# Patient Record
Sex: Female | Born: 2000 | Race: White | Hispanic: No | Marital: Single | Smoking: Never smoker
Health system: Southern US, Community
[De-identification: ages and names within clinical notes are randomized; demographics above are authoritative.]

## PROBLEM LIST (undated history)

## (undated) DIAGNOSIS — K219 Gastro-esophageal reflux disease without esophagitis: Secondary | ICD-10-CM

## (undated) DIAGNOSIS — Z22322 Carrier or suspected carrier of Methicillin resistant Staphylococcus aureus: Secondary | ICD-10-CM

## (undated) HISTORY — PX: TONSILLECTOMY: SUR1361

---
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
## 2020-06-03 ENCOUNTER — Emergency Department (HOSPITAL_COMMUNITY): Payer: Medicaid Other

## 2020-06-03 ENCOUNTER — Other Ambulatory Visit: Payer: Self-pay

## 2020-06-03 ENCOUNTER — Encounter (HOSPITAL_COMMUNITY): Payer: Self-pay | Admitting: Emergency Medicine

## 2020-06-03 ENCOUNTER — Emergency Department (HOSPITAL_COMMUNITY)
Admission: EM | Admit: 2020-06-03 | Discharge: 2020-06-03 | Disposition: A | Discharge status: Home / Self Care | Payer: Medicaid Other | Attending: Emergency Medicine | Admitting: Emergency Medicine

## 2020-06-03 DIAGNOSIS — R112 Nausea with vomiting, unspecified: Secondary | ICD-10-CM | POA: Insufficient documentation

## 2020-06-03 DIAGNOSIS — R1013 Epigastric pain: Secondary | ICD-10-CM | POA: Diagnosis not present

## 2020-06-03 DIAGNOSIS — N644 Mastodynia: Secondary | ICD-10-CM | POA: Insufficient documentation

## 2020-06-03 DIAGNOSIS — Z7722 Contact with and (suspected) exposure to environmental tobacco smoke (acute) (chronic): Secondary | ICD-10-CM | POA: Diagnosis not present

## 2020-06-03 DIAGNOSIS — R1010 Upper abdominal pain, unspecified: Secondary | ICD-10-CM | POA: Diagnosis present

## 2020-06-03 LAB — COMPREHENSIVE METABOLIC PANEL
ALT: 35 U/L (ref 0–44)
AST: 24 U/L (ref 15–41)
Albumin: 3.7 g/dL (ref 3.5–5.0)
Alkaline Phosphatase: 74 U/L (ref 38–126)
Anion gap: 9 (ref 5–15)
BUN: 8 mg/dL (ref 6–20)
CO2: 25 mmol/L (ref 22–32)
Calcium: 9 mg/dL (ref 8.9–10.3)
Chloride: 103 mmol/L (ref 98–111)
Creatinine, Ser: 0.61 mg/dL (ref 0.44–1.00)
GFR, Estimated: >60 mL/min (ref >60)
Glucose, Bld: 82 mg/dL (ref 70–99)
Potassium: 3.8 mmol/L (ref 3.5–5.1)
Sodium: 137 mmol/L (ref 135–145)
Total Bilirubin: 0.5 mg/dL (ref 0.3–1.2)
Total Protein: 7.4 g/dL (ref 6.5–8.1)

## 2020-06-03 LAB — CBC WITH DIFFERENTIAL/PLATELET
Abs Immature Granulocytes: 0.04 10*3/uL (ref 0.00–0.07)
Basophils Absolute: 0.1 10*3/uL (ref 0.0–0.1)
Basophils Relative: 0 %
Eosinophils Absolute: 0.1 10*3/uL (ref 0.0–0.5)
Eosinophils Relative: 1 %
HCT: 35.2 % — ABNORMAL LOW (ref 36.0–46.0)
Hemoglobin: 11.3 g/dL — ABNORMAL LOW (ref 12.0–15.0)
Immature Granulocytes: 0 %
Lymphocytes Relative: 30 %
Lymphs Abs: 3.5 10*3/uL (ref 0.7–4.0)
MCH: 28.7 pg (ref 26.0–34.0)
MCHC: 32.1 g/dL (ref 30.0–36.0)
MCV: 89.3 fL (ref 80.0–100.0)
Monocytes Absolute: 1 10*3/uL (ref 0.1–1.0)
Monocytes Relative: 9 %
Neutro Abs: 7.1 10*3/uL (ref 1.7–7.7)
Neutrophils Relative %: 60 %
Platelets: 409 10*3/uL — ABNORMAL HIGH (ref 150–400)
RBC: 3.94 MIL/uL (ref 3.87–5.11)
RDW: 12.3 % (ref 11.5–15.5)
WBC: 11.8 10*3/uL — ABNORMAL HIGH (ref 4.0–10.5)
nRBC: 0 % (ref 0.0–0.2)

## 2020-06-03 LAB — URINALYSIS, ROUTINE W REFLEX MICROSCOPIC
Bilirubin Urine: NEGATIVE
Glucose, UA: NEGATIVE mg/dL
Hgb urine dipstick: NEGATIVE
Ketones, ur: NEGATIVE mg/dL
Leukocytes,Ua: NEGATIVE
Nitrite: NEGATIVE
Protein, ur: NEGATIVE mg/dL
Specific Gravity, Urine: 1.02 (ref 1.005–1.030)
pH: 7 (ref 5.0–8.0)

## 2020-06-03 LAB — LIPASE, BLOOD: Lipase: 35 U/L (ref 11–51)

## 2020-06-03 LAB — HCG, QUANTITATIVE, PREGNANCY: hCG, Beta Chain, Quant, S: <1 m[IU]/mL (ref <5)

## 2020-06-03 MED ORDER — ONDANSETRON HCL 4 MG PO TABS: 4.0000 mg | ORAL_TABLET | Freq: Three times a day (TID) | ORAL | 0 refills | Status: AC | PRN

## 2020-06-03 MED ORDER — PANTOPRAZOLE SODIUM 40 MG PO TBEC: 40.0000 mg | DELAYED_RELEASE_TABLET | Freq: Every day | ORAL | 0 refills | Status: AC

## 2020-06-03 NOTE — Discharge Instructions (Signed)
As discussed, take the pantoprazole daily as directed for at least 2 weeks.  Take the ondansetron if needed for nausea or vomiting.  Follow-up with your primary care provider for recheck.  Return to the emergency department if you develop any new or worsening symptoms such as increasing abdominal pain, fever or persistent vomiting.

## 2020-06-03 NOTE — ED Triage Notes (Signed)
Vomits every morning x 2 weeks, only when she wakes up each day.  Had neg preg test on Saturday and neg covid today.

## 2020-06-05 NOTE — ED Provider Notes (Signed)
[facility] EMERGENCY DEPARTMENT Provider Note   CSN: [id number] Arrival date & time: 06/03/20  1120     History Chief Complaint  Patient presents with  . Emesis    Tanya Berger is a 20 y.o. female.  HPI      Tanya Berger is a 20 y.o. female who presents to the Emergency Department complaining of morning nausea and vomiting, breast tenderness, and feels as though her abdomen is distended.  Symptoms have been present for 2 weeks.  She is concerned that she may be pregnant.  LMP 04/22/2020.  She took a home pregnancy test 3 days ago that was negative.  Also had a negative COVID test.  Endorses some upper abdominal pain.  She denies diarrhea, back or flank pain, dysuria, vaginal discharge.  No fever or chills.    Past Medical History:  Diagnosis Date  . Acid reflux   . MRSA (methicillin resistant staph aureus) culture positive     Patient Active Problem List   Diagnosis Date Noted  . Distal radius fracture, right 10/06/17 11/22/2017    Past Surgical History:  Procedure Laterality Date  . TONSILLECTOMY       OB History   No obstetric history on file.     Family History  Problem Relation Age of Onset  . Hyperlipidemia Mother     Social History   Tobacco Use  . Smoking status: Passive Smoke Exposure - Never Smoker  . Smokeless tobacco: Never Used  Substance Use Topics  . Alcohol use: Never  . Drug use: Never    Home Medications Prior to Admission medications   Medication Sig Start Date End Date Taking? Authorizing Provider  ondansetron (ZOFRAN) 4 MG tablet Take 1 tablet (4 mg total) by mouth every 8 (eight) hours as needed for nausea or vomiting. 06/03/20  Yes Joni Colegrove, PA-C  pantoprazole (PROTONIX) 40 MG tablet Take 1 tablet (40 mg total) by mouth daily. In the morning 06/03/20  Yes Jaylianna Tatlock, PA-C  Amphet-Dextroamphet 3-Bead ER 37.5 MG CP24 Take 1 capsule by mouth daily.    [provider]  busPIRone (BUSPAR) 15 MG tablet Take 15  mg by mouth 2 (two) times daily.    [provider]  ibuprofen (ADVIL,MOTRIN) 400 MG tablet Take 1 tablet (400 mg total) by mouth every 6 (six) hours as needed. 10/06/17   Burgess Amor, PA-C  loratadine (CLARITIN) 10 MG tablet Take 10 mg by mouth daily.    [provider]  Norgestimate-Ethinyl Estradiol Triphasic (TRI-LO-MARZIA) 0.18/0.215/0.25 MG-25 MCG tab Take 1 tablet by mouth daily.    [provider]  ondansetron (ZOFRAN ODT) 4 MG disintegrating tablet Take 1 tablet (4 mg total) by mouth every 8 (eight) hours as needed for nausea or vomiting. 05/25/17   Long, Arlyss Repress, MD  ranitidine (ZANTAC) 75 MG tablet Take 75 mg by mouth 2 (two) times daily as needed for heartburn.    [provider]  traZODone (DESYREL) 50 MG tablet Take 50 mg by mouth at bedtime.    [provider]    Allergies    Penicillins  Review of Systems   Review of Systems  Constitutional: Negative for chills, fatigue and fever.  HENT: Negative for sore throat.   Respiratory: Negative for shortness of breath and wheezing.   Cardiovascular: Negative for chest pain and palpitations.  Gastrointestinal: Positive for abdominal pain, nausea and vomiting. Negative for blood in stool and constipation.  Genitourinary: Positive for menstrual problem. Negative for  dysuria, flank pain, frequency, vaginal bleeding and vaginal discharge.  Musculoskeletal: Negative for arthralgias, back pain, myalgias, neck pain and neck stiffness.  Skin: Negative for rash.  Neurological: Negative for dizziness, weakness and numbness.  Hematological: Does not bruise/bleed easily.    Physical Exam Updated Vital Signs BP 110/66   Pulse 73   Temp 98.4 F (36.9 C) (Oral)   Resp 18   Ht 5\' 2"  (1.575 m)   Wt 87.1 kg   LMP 04/22/2020   SpO2 99%   BMI 35.12 kg/m   Physical Exam Vitals and nursing note reviewed.  Constitutional:      General: She is not in acute distress.    Appearance: Normal  appearance. She is not ill-appearing.  HENT:     Mouth/Throat:     Mouth: Mucous membranes are moist.  Cardiovascular:     Rate and Rhythm: Normal rate and regular rhythm.     Pulses: Normal pulses.  Pulmonary:     Effort: Pulmonary effort is normal.     Breath sounds: Normal breath sounds.  Abdominal:     General: There is no distension.     Palpations: Abdomen is soft.     Tenderness: There is abdominal tenderness. There is no right CVA tenderness, left CVA tenderness, guarding or rebound.     Comments: Mild epigastric tenderness without guarding or rebound.  No  abdominal distention  Musculoskeletal:        General: Normal range of motion.  Skin:    General: Skin is warm.     Capillary Refill: Capillary refill takes less than 2 seconds.     Findings: No rash.  Neurological:     General: No focal deficit present.     Mental Status: She is alert.     Sensory: No sensory deficit.     Motor: No weakness.     ED Results / Procedures / Treatments   Labs (all labs ordered are listed, but only abnormal results are displayed) Labs Reviewed  CBC WITH DIFFERENTIAL/PLATELET - Abnormal; Notable for the following components:      Result Value   WBC 11.8 (*)    Hemoglobin 11.3 (*)    HCT 35.2 (*)    Platelets 409 (*)    All other components within normal limits  URINALYSIS, ROUTINE W REFLEX MICROSCOPIC - Abnormal; Notable for the following components:   APPearance CLOUDY (*)    All other components within normal limits  COMPREHENSIVE METABOLIC PANEL  LIPASE, BLOOD  HCG, QUANTITATIVE, PREGNANCY    EKG None  Radiology 06/22/2020 Abdomen Limited  Result Date: 06/03/2020 CLINICAL DATA:  Right upper quadrant pain with nausea and vomiting EXAM: ULTRASOUND ABDOMEN LIMITED RIGHT UPPER QUADRANT COMPARISON:  None. FINDINGS: Gallbladder: No gallstones or wall thickening visualized. There is no pericholecystic fluid. No sonographic Murphy sign noted by sonographer. Common bile duct: Diameter: 3  mm. No intrahepatic or extrahepatic biliary duct dilatation. Liver: No focal lesion identified. Liver echogenicity overall increased. Portal vein is patent on color Doppler imaging with normal direction of blood flow towards the liver. Other: None. IMPRESSION: Increase in liver echogenicity, a finding likely due to a degree of underlying hepatic steatosis. No focal liver lesions. Study otherwise unremarkable. Electronically Signed   By: 08/03/2020 III M.D.   On: 06/03/2020 14:54    Procedures Procedures   Medications Ordered in ED Medications - No data to display  ED Course  I have reviewed the triage vital signs and the nursing notes.  Pertinent labs & imaging results that were available during my care of the patient were reviewed by me and considered in my medical decision making (see chart for details).    MDM Rules/Calculators/A&P                          Patient here for evaluation and concern of possible pregnancy.  Admits to some upper abdominal tenderness.  On exam, she is well-appearing nontoxic.  Doubt acute abdomen.  Labs interpreted by me, no evidence of leukocytosis or electrolyte derangement.  Serum hCG negative for pregnancy.  Symptoms possibly related to gallbladder disease.  Will order gallbladder ultrasound.  Ultrasound of the right upper quadrant negative for evidence of acute gallbladder disease.  On further history, patient does endorse late night eating and poor diet.  Symptoms felt to be related to GERD.  Will start PPI.  Patient has upcoming appointment with OB/GYN.  I feel that she is appropriate for discharge home I doubt acute abdominal process.  Patient agreeable to plan.  Strict return precautions discussed.   Final Clinical Impression(s) / ED Diagnoses Final diagnoses:  Epigastric pain    Rx / DC Orders ED Discharge Orders         Ordered    pantoprazole (PROTONIX) 40 MG tablet  Daily        06/03/20 1549    ondansetron (ZOFRAN) 4 MG tablet  Every  8 hours PRN        06/03/20 1549           Pauline Aus, PA-C 06/05/20 1251    Bethann Berkshire, MD 06/10/20 [id number]

## 2021-01-08 ENCOUNTER — Other Ambulatory Visit: Payer: Self-pay

## 2021-01-08 ENCOUNTER — Encounter (HOSPITAL_COMMUNITY): Payer: Self-pay | Admitting: Obstetrics & Gynecology

## 2021-01-08 ENCOUNTER — Inpatient Hospital Stay (HOSPITAL_COMMUNITY)
Admission: AD | Admit: 2021-01-08 | Discharge: 2021-01-08 | Disposition: A | Discharge status: Home / Self Care | Payer: Medicaid Other | Attending: Obstetrics & Gynecology | Admitting: Obstetrics & Gynecology

## 2021-01-08 DIAGNOSIS — L989 Disorder of the skin and subcutaneous tissue, unspecified: Secondary | ICD-10-CM | POA: Diagnosis not present

## 2021-01-08 DIAGNOSIS — O99713 Diseases of the skin and subcutaneous tissue complicating pregnancy, third trimester: Secondary | ICD-10-CM | POA: Insufficient documentation

## 2021-01-08 DIAGNOSIS — R109 Unspecified abdominal pain: Secondary | ICD-10-CM | POA: Diagnosis present

## 2021-01-08 DIAGNOSIS — O24419 Gestational diabetes mellitus in pregnancy, unspecified control: Secondary | ICD-10-CM | POA: Insufficient documentation

## 2021-01-08 DIAGNOSIS — O26893 Other specified pregnancy related conditions, third trimester: Secondary | ICD-10-CM | POA: Insufficient documentation

## 2021-01-08 DIAGNOSIS — Z3A33 33 weeks gestation of pregnancy: Secondary | ICD-10-CM | POA: Insufficient documentation

## 2021-01-08 DIAGNOSIS — Z880 Allergy status to penicillin: Secondary | ICD-10-CM | POA: Insufficient documentation

## 2021-01-08 DIAGNOSIS — Z3689 Encounter for other specified antenatal screening: Secondary | ICD-10-CM

## 2021-01-08 LAB — WET PREP, GENITAL
Clue Cells Wet Prep HPF POC: NONE SEEN
Sperm: NONE SEEN
Trich, Wet Prep: NONE SEEN
WBC, Wet Prep HPF POC: >=10 — AB (ref <10)
Yeast Wet Prep HPF POC: NONE SEEN

## 2021-01-08 MED ORDER — CLINDAMYCIN HCL 300 MG PO CAPS: 300.0000 mg | ORAL_CAPSULE | Freq: Four times a day (QID) | ORAL | 0 refills | Status: AC

## 2021-01-08 MED ORDER — TERCONAZOLE 0.8 % VA CREA: 1.0000 | TOPICAL_CREAM | Freq: Every day | VAGINAL | 0 refills | Status: AC

## 2021-01-08 NOTE — MAU Note (Signed)
Tanya Berger is a 20 y.o. at [redacted]w[redacted]d here in MAU reporting: states she has had a bump under her abdomen and today it "busted". States a bunch of green, bloody, chunky stuff came out. Is having lower abdominal pain and DFM. Denies vaginal bleeding and LOF.  Onset of complaint: today  Pain score: 8/10  Vitals:   01/08/21 1301  BP: 116/66  Pulse: 97  Resp: 16  Temp: 98.1 F (36.7 C)  SpO2: 99%     FHT:141  Lab orders placed from triage: none

## 2021-01-08 NOTE — MAU Provider Note (Signed)
History     CSN: [id number]  Arrival date and time: 01/08/21 1243   Event Date/Time   First Provider Initiated Contact with Patient 01/08/21 1318      Chief Complaint  Patient presents with   Abdominal Pain   HPI IZADORA ROEHR is a 20 y.o. G1P0 at [redacted]w[redacted]d at [redacted]w[redacted]d who presents to MAU with chief complaint of bleeding. Patient states she has a benign skin lesion on her abdomen "my little bump on my underneath". While getting gas a short time ago she felt it burst and experienced new onset bleeding. On arrival to MAU she states that she is not sure if her bleeding is coming from that bump or from her perineum. She denies leaking of fluid, decreased fetal movement, fever, falls, or recent illness. Patient states she gets these "bumps" "all the time" but before now they've been limited to her groin and upper thigh. She lances them at home or presents to an ED for I&D. Her mother has identical lesions and manages them the same way. They have not been seen by Dermatology.  Her pregnancy is complicated by A1GDM. Patient receives care with [facility]. Her next OB appointment is tomorrow 01/09/2021.  OB History     Gravida  1   Para      Term      Preterm      AB      Living         SAB      IAB      Ectopic      Multiple      Live Births              Past Medical History:  Diagnosis Date   Acid reflux    MRSA (methicillin resistant staph aureus) culture positive     Past Surgical History:  Procedure Laterality Date   TONSILLECTOMY      Family History  Problem Relation Age of Onset   Hyperlipidemia Mother     Social History   Tobacco Use   Smoking status: Never    Passive exposure: Yes   Smokeless tobacco: Never  Substance Use Topics   Alcohol use: Never   Drug use: Never    Allergies:  Allergies  Allergen Reactions   Penicillins Nausea And Vomiting    Has patient had a PCN reaction causing immediate rash, facial/tongue/throat swelling, SOB or  lightheadedness with hypotension: No Has patient had a PCN reaction causing severe rash involving mucus membranes or skin necrosis: No Has patient had a PCN reaction that required hospitalization: No Has patient had a PCN reaction occurring within the last 10 years: No If all of the above answers are "NO", then may proceed with Cephalosporin use.     Medications Prior to Admission  Medication Sig Dispense Refill Last Dose   busPIRone (BUSPAR) 15 MG tablet Take 15 mg by mouth 2 (two) times daily.   Past Week   ondansetron (ZOFRAN ODT) 4 MG disintegrating tablet Take 1 tablet (4 mg total) by mouth every 8 (eight) hours as needed for nausea or vomiting. 20 tablet 0 Past Month   ondansetron (ZOFRAN) 4 MG tablet Take 1 tablet (4 mg total) by mouth every 8 (eight) hours as needed for nausea or vomiting. 12 tablet 0 Past Month   pantoprazole (PROTONIX) 40 MG tablet Take 1 tablet (40 mg total) by mouth daily. In the morning 30 tablet 0 Past Month   Prenatal Vit-Fe Fumarate-FA (PRENATAL MULTIVITAMIN) TABS tablet  Take 1 tablet by mouth daily at 12 noon.   01/08/2021   Amphet-Dextroamphet 3-Bead ER 37.5 MG CP24 Take 1 capsule by mouth daily.      ibuprofen (ADVIL,MOTRIN) 400 MG tablet Take 1 tablet (400 mg total) by mouth every 6 (six) hours as needed. 30 tablet 0    loratadine (CLARITIN) 10 MG tablet Take 10 mg by mouth daily.      Norgestimate-Ethinyl Estradiol Triphasic 0.18/0.215/0.25 MG-25 MCG tab Take 1 tablet by mouth daily.      ranitidine (ZANTAC) 75 MG tablet Take 75 mg by mouth 2 (two) times daily as needed for heartburn.      traZODone (DESYREL) 50 MG tablet Take 50 mg by mouth at bedtime.       Review of Systems  Gastrointestinal:  Positive for abdominal pain.       Proximal to lesion  All other systems reviewed and are negative. Physical Exam   Blood pressure 112/66, pulse 95, temperature 98.1 F (36.7 C), temperature source Oral, resp. rate 15, height 5\' 2"  (1.575 m), weight 86.7  kg, last menstrual period 04/22/2020, SpO2 100 %.  Physical Exam Vitals and nursing note reviewed. Exam conducted with a chaperone present.  Constitutional:      Appearance: She is well-developed. She is obese.  Cardiovascular:     Rate and Rhythm: Normal rate and regular rhythm.     Heart sounds: Normal heart sounds.  Pulmonary:     Effort: Pulmonary effort is normal.     Breath sounds: Normal breath sounds.  Abdominal:     Comments: Gravid  Genitourinary:    Vagina: Normal.     Cervix: Normal.     Comments: Pelvic exam: External genitalia normal, vaginal walls pink and well rugated, cervix visually closed, no lesions noted.    Skin:    Comments: Abdominal lesion as indicated in photo. No tunneling. No discharge with manual pressure. Small area of erythema.  Neurological:     Mental Status: She is alert.    MAU Course  Procedures  --PCN allergy reported as nausea and vomiting. Discussed that this is not true allergy. Patient and her mother verbalize understanding but wish to avoid all PCNs  --Reactive tracing: baseline 125, mod var, + accels, no decels  --Toco: UI  --Medication regimen discussed with Pharmacy.  Orders Placed This Encounter  Procedures   Wet prep, genital   MRSA culture   Discharge patient   Patient Vitals for the past 24 hrs:  BP Temp Temp src Pulse Resp SpO2 Height Weight  01/08/21 1346 112/66 -- -- 95 15 100 % -- --  01/08/21 1311 121/71 -- -- (!) 110 -- -- -- --  01/08/21 1301 116/66 98.1 F (36.7 C) Oral 97 16 99 % -- --  01/08/21 1254 -- -- -- -- -- -- 5\' 2"  (1.575 m) 86.7 kg   Results for orders placed or performed during the hospital encounter of 01/08/21 (from the past 24 hour(s))  Wet prep, genital     Status: Abnormal   Collection Time: 01/08/21  2:08 PM   Specimen: PATH Cytology Cervicovaginal Ancillary Only  Result Value Ref Range   Yeast Wet Prep HPF POC NONE SEEN NONE SEEN   Trich, Wet Prep NONE SEEN NONE SEEN   Clue Cells Wet  Prep HPF POC NONE SEEN NONE SEEN   WBC, Wet Prep HPF POC >=10 (A) <10   Sperm NONE SEEN      Media Information    Meds  ordered this encounter  Medications   clindamycin (CLEOCIN) 300 MG capsule    Sig: Take 1 capsule (300 mg total) by mouth 4 (four) times daily for 5 days.    Dispense:  20 capsule    Refill:  0    Order Specific Question:   Supervising Provider    Answer:   Adam Phenix [[id number]]   terconazole (TERAZOL 3) 0.8 % vaginal cream    Sig: Place 1 applicator vaginally at bedtime. Apply nightly for three nights.    Dispense:  20 g    Refill:  0    Order Specific Question:   Supervising Provider    Answer:   Adam Phenix [[id number]]   Assessment and Plan  --20 y.o. G1P0 at [redacted]w[redacted]d  --Reactive tracing --Closed cervix --Skin lesion, new outpatient abx regimen --Discharge home in stable condition  F/U: --Patient to attend [facility] appointment tomorrow  Calvert Cantor, CNM 01/08/2021, 8:12 PM

## 2021-01-09 LAB — GC/CHLAMYDIA PROBE AMP (~~LOC~~) NOT AT ARMC
Chlamydia: NEGATIVE
Comment: NEGATIVE
Comment: NORMAL
Neisseria Gonorrhea: NEGATIVE

## 2021-01-10 LAB — MRSA CULTURE: Culture: NOT DETECTED

## 2022-09-26 IMAGING — US US ABDOMEN LIMITED RUQ/ASCITES
1 series · 14 of 25 positions shown · non-contrast
Comparison: None.

CLINICAL DATA: Right upper quadrant pain with nausea and vomiting

EXAM:
ULTRASOUND ABDOMEN LIMITED RIGHT UPPER QUADRANT

[Series 1: us abdomen limited · 14 of 78 slices shown]
[im 1/78]
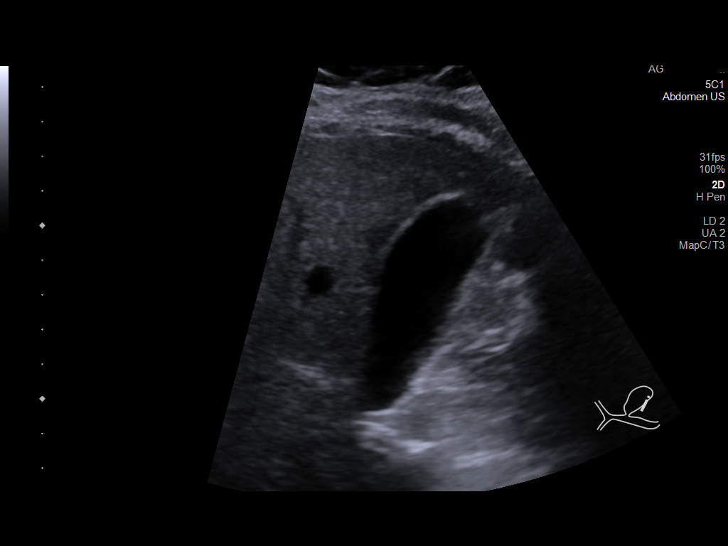
[im 7/78]
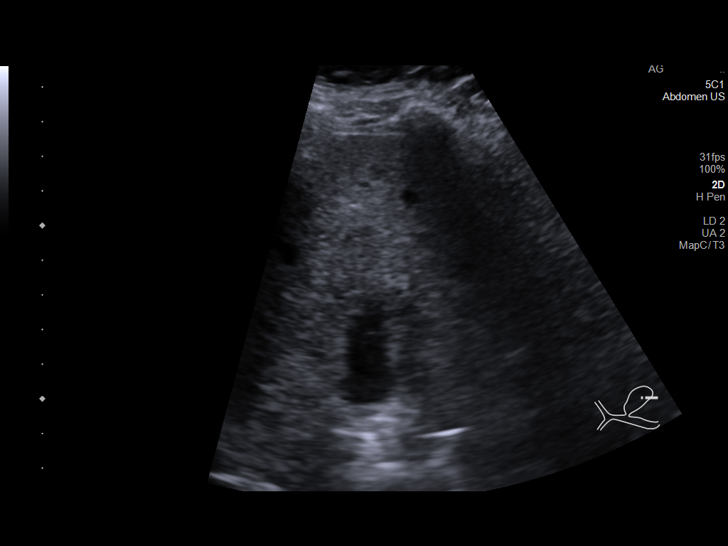
[im 13/78]
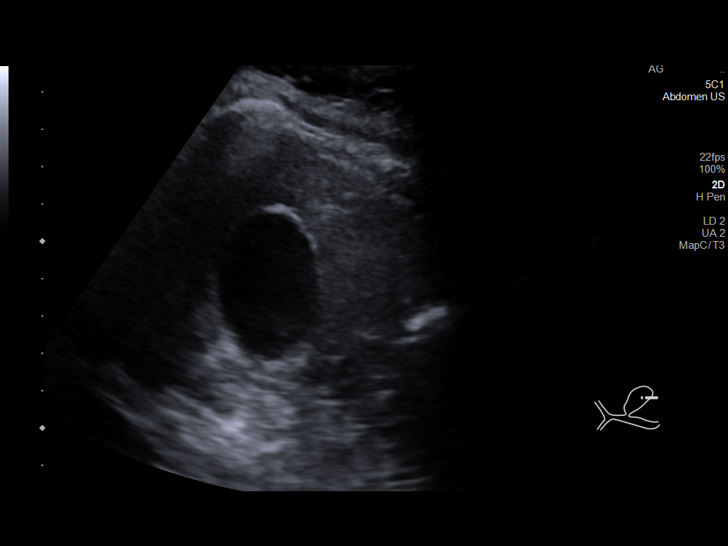
[im 20/78]
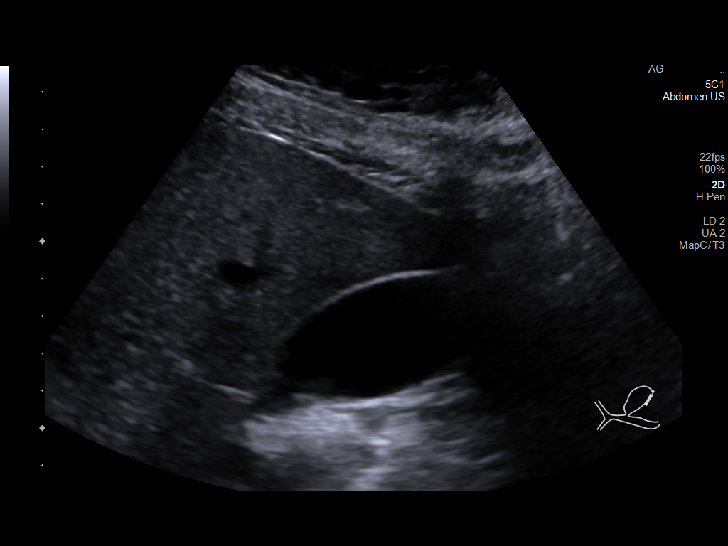
[im 26/78]
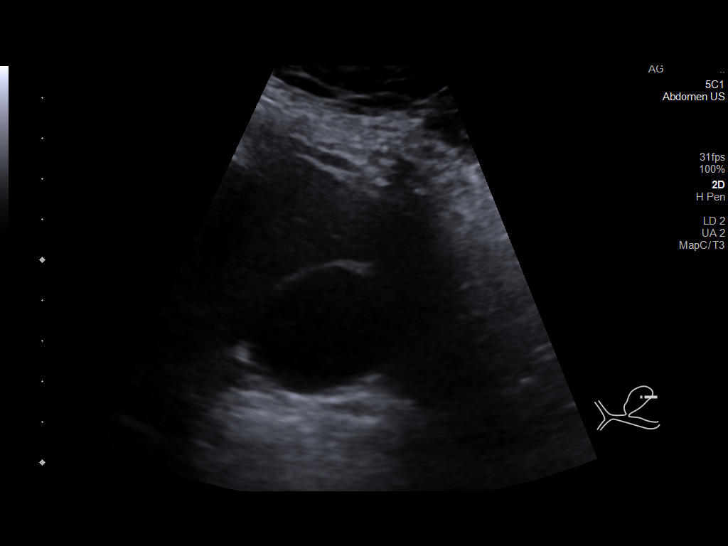
[im 29/78]
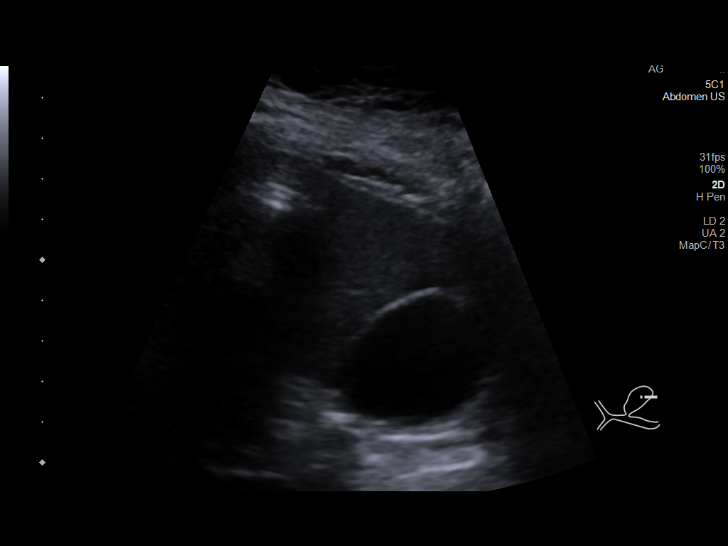
[im 36/78]
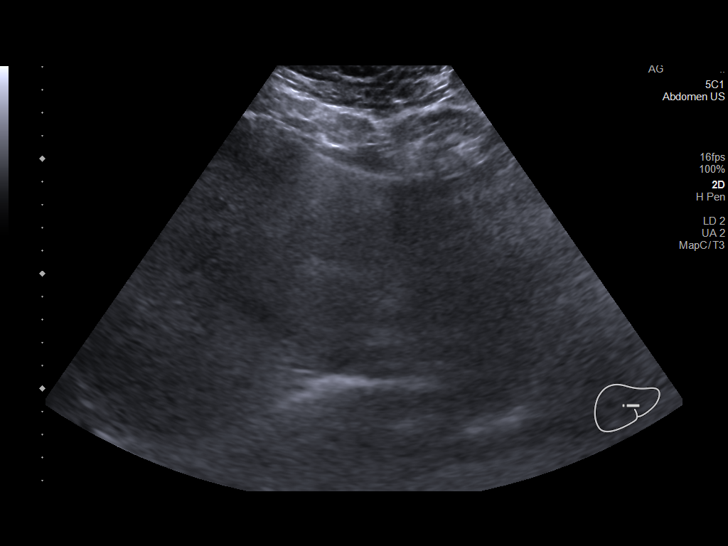
[im 42/78]
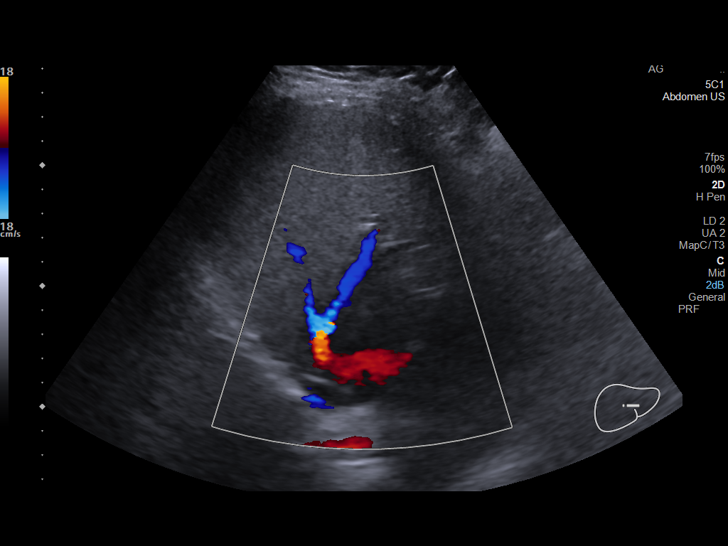
[im 49/78]
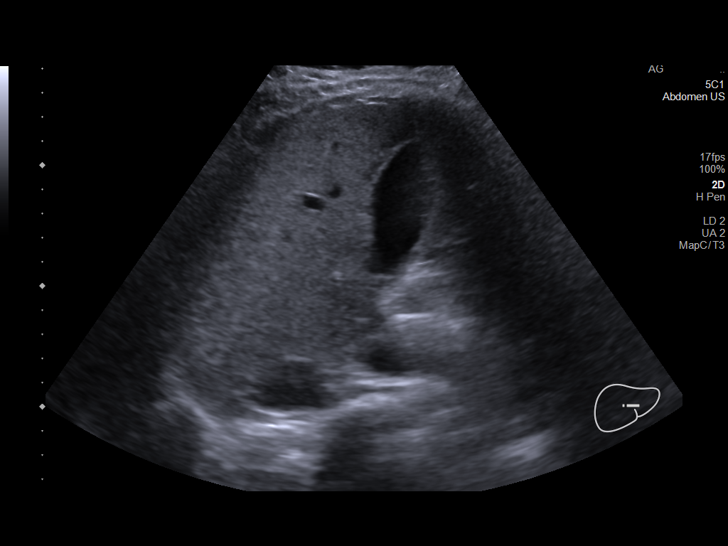
[im 52/78]
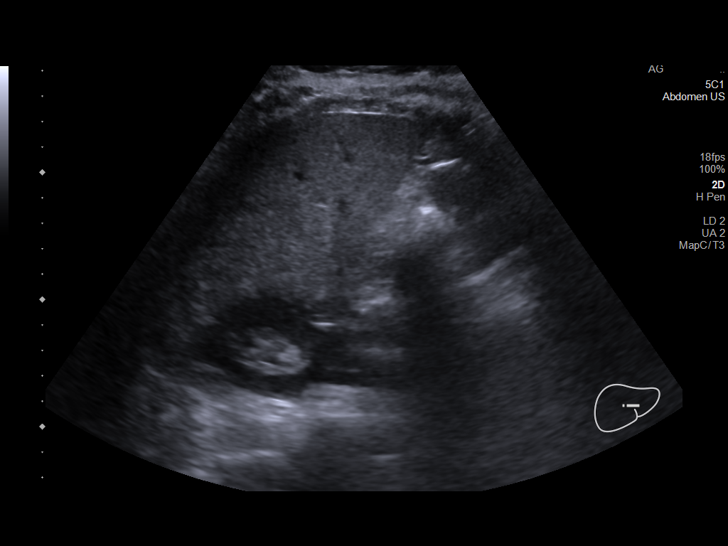
[im 58/78]
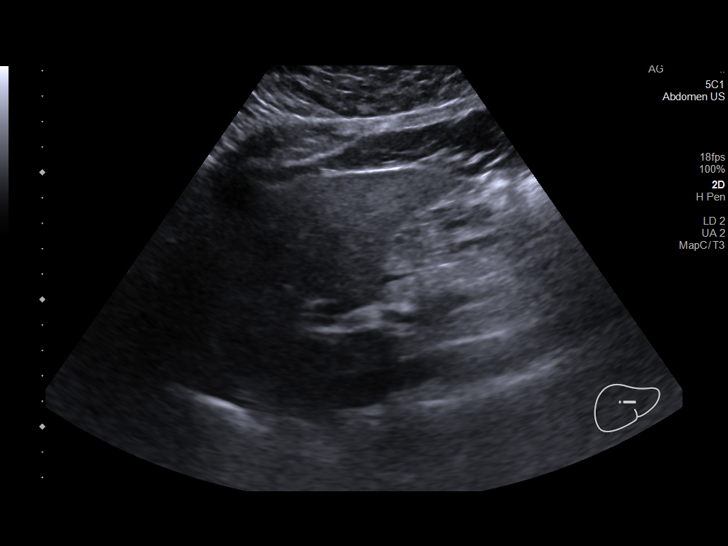
[im 65/78]
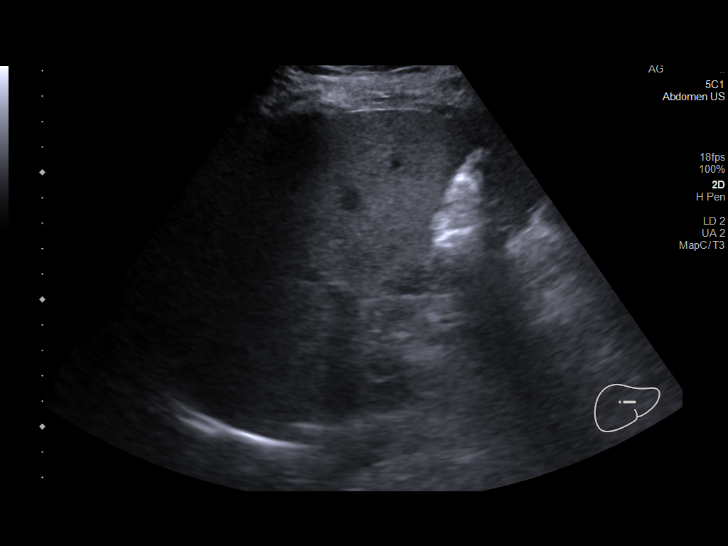
[im 71/78]
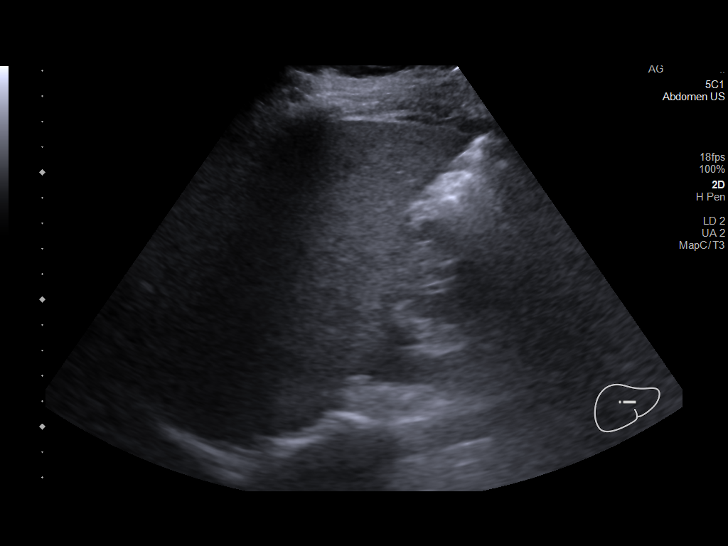
[im 78/78]
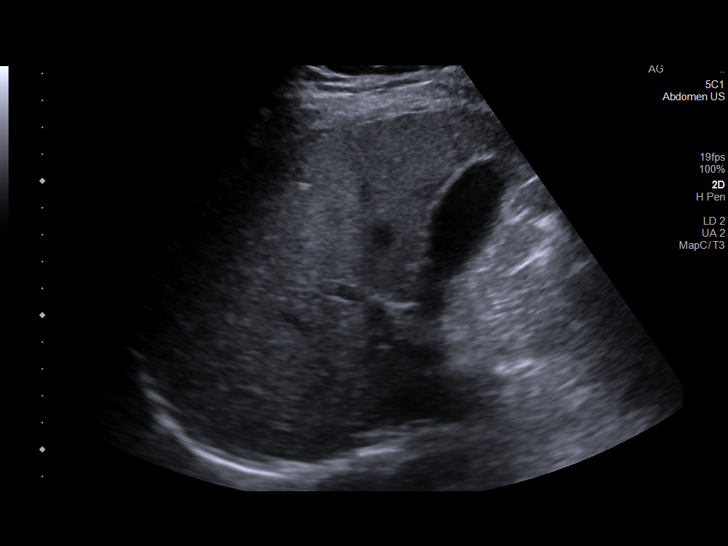

[14 of 25 positions shown; findings below may reference images not displayed]

FINDINGS: Gallbladder:

No gallstones or wall thickening visualized. There is no
pericholecystic fluid. No sonographic Murphy sign noted by
sonographer.

Common bile duct:

Diameter: 3 mm. No intrahepatic or extrahepatic biliary duct
dilatation.

Liver:

No focal lesion identified. Liver echogenicity overall increased.
Portal vein is patent on color Doppler imaging with normal direction
of blood flow towards the liver.

Other: None.
IMPRESSION: Increase in liver echogenicity, a finding likely due to a degree of
underlying hepatic steatosis. No focal liver lesions. Study
otherwise unremarkable.
